# Patient Record
Sex: Male | Born: 1980 | Hispanic: No | Smoking: Former smoker
Health system: Southern US, Community
[De-identification: ages and names within clinical notes are randomized; demographics above are authoritative.]

---

## 2018-10-21 ENCOUNTER — Encounter: Payer: Self-pay | Admitting: Podiatry

## 2018-10-21 ENCOUNTER — Ambulatory Visit: Payer: 59 | Admitting: Podiatry

## 2018-10-21 ENCOUNTER — Other Ambulatory Visit: Payer: Self-pay | Admitting: Podiatry

## 2018-10-21 ENCOUNTER — Ambulatory Visit (INDEPENDENT_AMBULATORY_CARE_PROVIDER_SITE_OTHER): Payer: 59

## 2018-10-21 VITALS — BP 110/52

## 2018-10-21 DIAGNOSIS — M79672 Pain in left foot: Secondary | ICD-10-CM

## 2018-10-21 DIAGNOSIS — M779 Enthesopathy, unspecified: Secondary | ICD-10-CM

## 2018-10-21 NOTE — Progress Notes (Signed)
Subjective:   Patient ID: Patrick Peters, male   DOB: 38 y.o.   MRN: [id number]   HPI Patient presents stating that he is very active and loves to run marathons and he has developed some pain in his left foot and then developed some red spots on his digits which were somewhat irritative.  Patient used to smoke and has stopped it is very active at this time   Review of Systems  All other systems reviewed and are negative.       Objective:  Physical Exam Vitals signs and nursing note reviewed.  Constitutional:      Appearance: He is well-developed.  Pulmonary:     Effort: Pulmonary effort is normal.  Musculoskeletal: Normal range of motion.  Skin:    General: Skin is warm.  Neurological:     Mental Status: He is alert.     Neurovascular status intact muscle strength adequate range of motion within normal limits with patient noted to have mild dorsal tendon irritation left and is noted to have discoloration of the distal digits left over right that are not as bad but for a while he stated were very pinpoint and painful.  Patient does have cool toes and states at times he seems to get symptoms associated with this.  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  We may consider dorsal tendon injection left if symptoms worsen but at this time we will try ice therapy and will be seen back as needed  X-ray indicates no signs of stress fracture arthritis or bone spur formation

## 2018-10-21 NOTE — Patient Instructions (Signed)
Raynaud Phenomenon    Raynaud phenomenon is a condition that affects the blood vessels (arteries) that carry blood to your fingers and toes. The arteries that supply blood to your ears, lips, nipples, or the tip of your nose might also be affected. Raynaud phenomenon causes the arteries to become narrow temporarily (spasm). As a result, the flow of blood to the affected areas is temporarily decreased. This usually occurs in response to cold temperatures or stress. During an attack, the skin in the affected areas turns white, then blue, and finally red. You may also feel tingling or numbness in those areas.  Attacks usually last for only a brief period, and then the blood flow to the area returns to normal. In most cases, Raynaud phenomenon does not cause serious health problems.  What are the causes?  In many cases, the cause of this condition is not known. The condition may occur on its own (primary Raynaud phenomenon) or may be associated with other diseases or factors (secondary Raynaud phenomenon).  Possible causes may include:  · Diseases or medical conditions that damage the arteries.  · Injuries and repetitive actions that hurt the hands or feet.  · Being exposed to certain chemicals.  · Taking medicines that narrow the arteries.  · Other medical conditions, such as lupus, scleroderma, rheumatoid arthritis, thyroid problems, blood disorders, Sjogren syndrome, or atherosclerosis.  What increases the risk?  The following factors may make you more likely to develop this condition:  · Being 20-40 years old.  · Being male.  · Having a family history of Raynaud phenomenon.  · Living in a cold climate.  · Smoking.  What are the signs or symptoms?  Symptoms of this condition usually occur when you are exposed to cold temperatures or when you have emotional stress. The symptoms may last for a few minutes or up to several hours. They usually affect your fingers but may also affect your toes, nipples, lips, ears, or  the tip of your nose. Symptoms may include:  · Changes in skin color. The skin in the affected areas will turn pale or white. The skin may then change from white to bluish to red as normal blood flow returns to the area.  · Numbness, tingling, or pain in the affected areas.  In severe cases, symptoms may include:  · Skin sores.  · Tissues decaying and dying (gangrene).  How is this diagnosed?  This condition may be diagnosed based on:  · Your symptoms and medical history.  · A physical exam. During the exam, you may be asked to put your hands in cold water to check for a reaction to cold temperature.  · Tests, such as:  ? Blood tests to check for other diseases or conditions.  ? A test to check the movement of blood through your arteries and veins (vascular ultrasound).  ? A test in which the skin at the base of your fingernail is examined under a microscope (nailfold capillaroscopy).  How is this treated?  Treatment for this condition often involves making lifestyle changes and taking steps to control your exposure to cold temperatures. For more severe cases, medicine (calcium channel blockers) may be used to improve blood flow. Surgery is sometimes done to block the nerves that control the affected arteries, but this is rare.  Follow these instructions at home:  Avoiding cold temperatures  Take these steps to avoid exposure to cold:  · If possible, stay indoors during cold weather.  · When you   go outside during cold weather, dress in layers and wear mittens, a hat, a scarf, and warm footwear.  · Wear mittens or gloves when handling ice or frozen food.  · Use holders for glasses or cans containing cold drinks.  · Let warm water run for a while before taking a shower or bath.  · Warm up the car before driving in cold weather.  Lifestyle    · If possible, avoid stressful and emotional situations. Try to find ways to manage your stress, such as:  ? Exercise.  ? Yoga.  ? Meditation.  ? Biofeedback.  · Do not use any  products that contain nicotine or tobacco, such as cigarettes and e-cigarettes. If you need help quitting, ask your health care provider.  · Avoid secondhand smoke.  · Limit your use of caffeine.  ? Switch to decaffeinated coffee, tea, and soda.  ? Avoid chocolate.  · Avoid vibrating tools and machinery.  General instructions  · Protect your hands and feet from injuries, cuts, or bruises.  · Avoid wearing tight rings or wristbands.  · Wear loose fitting socks and comfortable, roomy shoes.  · Take over-the-counter and prescription medicines only as told by your health care provider.  Contact a health care provider if:  · Your discomfort becomes worse despite lifestyle changes.  · You develop sores on your fingers or toes that do not heal.  · Your fingers or toes turn black.  · You have breaks in the skin on your fingers or toes.  · You have a fever.  · You have pain or swelling in your joints.  · You have a rash.  · Your symptoms occur on only one side of your body.  Summary  · Raynaud phenomenon is a condition that affects the arteries that carry blood to your fingers, toes, ears, lips, nipples, or the tip of your nose.  · In many cases, the cause of this condition is not known.  · Symptoms of this condition include changes in skin color, and numbness and tingling of the affected area.  · Treatment for this condition includes lifestyle changes, reducing exposure to cold temperatures, and using medicines for severe cases of the condition.  · Contact your health care provider if your condition worsens despite treatment.  This information is not intended to replace advice given to you by your health care provider. Make sure you discuss any questions you have with your health care provider.  Document Released: 08/22/2000 Document Revised: 03/10/2017 Document Reviewed: 10/06/2016  Elsevier Interactive Patient Education © 2019 [employer]

## 2018-10-26 ENCOUNTER — Encounter: Payer: Self-pay | Admitting: Plastic Surgery

## 2018-10-26 ENCOUNTER — Ambulatory Visit (INDEPENDENT_AMBULATORY_CARE_PROVIDER_SITE_OTHER): Payer: 59 | Admitting: Plastic Surgery

## 2018-10-26 VITALS — BP 120/80 | HR 60 | Temp 97.8°F | Ht 74.0 in | Wt 205.2 lb

## 2018-10-26 DIAGNOSIS — L723 Sebaceous cyst: Secondary | ICD-10-CM

## 2018-10-26 NOTE — Progress Notes (Signed)
     Patient ID: Patrick Peters, male    DOB: 1980-12-25, 39 y.o.   MRN: [id number]   Chief Complaint  Patient presents with  . Advice Only    for cyst beside the (L) eye    The patient is a 38 year old male here for evaluation of the cyst on the left periorbital lateral area.  He states it is been there for couple of years but seems to be getting larger.  He has a history of multiple cysts throughout his body particularly cystic acne on his face.  Things are calm right now and he seems to be doing well.  There does not appear to be any infection in the area and no redness.  He is otherwise healthy.   Review of Systems  Constitutional: Negative.  Negative for activity change.  HENT: Negative.   Eyes: Negative.   Respiratory: Negative.   Gastrointestinal: Negative.   Genitourinary: Negative.   Skin: Negative for wound.    History reviewed. No pertinent past medical history.  History reviewed. No pertinent surgical history.    Current Outpatient Medications:  .  Adapalene-Benzoyl Peroxide 0.1-2.5 % gel, , Disp: , Rfl:  .  clindamycin (CLINDAGEL) 1 % gel, Apply topically., Disp: , Rfl:  .  famotidine (PEPCID) 20 MG tablet, , Disp: , Rfl:    Objective:   Vitals:   10/26/18 1329  BP: 120/80  Pulse: 60  Temp: 97.8 F (36.6 C)  SpO2: 96%    Physical Exam Vitals signs and nursing note reviewed.  Constitutional:      Appearance: Normal appearance.  HENT:     Head: Normocephalic and atraumatic.  Eyes:   Cardiovascular:     Rate and Rhythm: Normal rate.  Abdominal:     General: Abdomen is flat. There is no distension.  Neurological:     Mental Status: He is alert.  Psychiatric:        Mood and Affect: Mood normal.        Thought Content: Thought content normal.        Judgment: Judgment normal.     Assessment & Plan:  Sebaceous cyst Plan for excision of left lateral cyst excision.  Alena Bills Cassidy Tashiro, DO

## 2018-11-23 ENCOUNTER — Ambulatory Visit: Payer: 59 | Admitting: Plastic Surgery

## 2018-11-23 ENCOUNTER — Encounter: Payer: Self-pay | Admitting: Plastic Surgery

## 2018-11-23 ENCOUNTER — Other Ambulatory Visit: Payer: Self-pay

## 2018-11-23 ENCOUNTER — Ambulatory Visit (INDEPENDENT_AMBULATORY_CARE_PROVIDER_SITE_OTHER): Payer: 59 | Admitting: Plastic Surgery

## 2018-11-23 VITALS — BP 125/77 | HR 55 | Temp 98.0°F | Ht 74.0 in | Wt 212.8 lb

## 2018-11-23 DIAGNOSIS — L723 Sebaceous cyst: Secondary | ICD-10-CM | POA: Diagnosis not present

## 2018-11-24 NOTE — Progress Notes (Signed)
Preoperative Dx: sebaceous cyst of left periorbital area  Postoperative Dx: Same  Procedure: excision of 1 cm left periorbital sebaceous cyst  Surgeon: Dr. Alan Ripper Dillingham  Anesthesia: Lidocaine 1% with 1:100,000 epinepherine  Indication for Procedure: cyst  Description of Procedure: Risks and complications were explained to the patient.  Consent was confirmed.  Time out was called and all information was confirmed to be correct.  The area was prepped with chlorhexidine and drapped.  Lidocaine 1% with epinepherine was injected in the subcutaneous area.  After waiting several minutes for the lidocaine to take affect a #15 blade was used to incise the skin over the 1 cm cyst.  The scissors were used to dissect around the capsule and remove it completely.  A 5-0 Monocryl was used to close the skin edges with simple interrupted sutures.  Steri strips were applied.  The patient is to follow up in one week.  He tolerated the procedure well and there were no complications.

## 2018-11-30 ENCOUNTER — Encounter: Payer: Self-pay | Admitting: Plastic Surgery

## 2018-11-30 ENCOUNTER — Other Ambulatory Visit: Payer: Self-pay

## 2018-11-30 ENCOUNTER — Ambulatory Visit (INDEPENDENT_AMBULATORY_CARE_PROVIDER_SITE_OTHER): Payer: 59 | Admitting: Plastic Surgery

## 2018-11-30 VITALS — BP 115/76 | HR 64 | Temp 98.0°F | Ht 74.0 in | Wt 213.0 lb

## 2018-11-30 DIAGNOSIS — L723 Sebaceous cyst: Secondary | ICD-10-CM

## 2018-11-30 NOTE — Progress Notes (Signed)
The patient is a 38 yrs old here for follow up after excision of a cyst on the left lateral periorbital area.  Area healing well.  No sign of infection.  Sutures removed.  Steri strip applied  Can be removed in a week.

## 2020-10-01 ENCOUNTER — Ambulatory Visit (HOSPITAL_COMMUNITY)
Admission: EM | Admit: 2020-10-01 | Discharge: 2020-10-01 | Disposition: A | Discharge status: Home / Self Care | Payer: Managed Care, Other (non HMO) | Attending: Internal Medicine | Admitting: Internal Medicine

## 2020-10-01 ENCOUNTER — Other Ambulatory Visit: Payer: Self-pay

## 2020-10-01 ENCOUNTER — Encounter (HOSPITAL_COMMUNITY): Payer: Self-pay

## 2020-10-01 ENCOUNTER — Ambulatory Visit (INDEPENDENT_AMBULATORY_CARE_PROVIDER_SITE_OTHER): Payer: Managed Care, Other (non HMO)

## 2020-10-01 DIAGNOSIS — S62355A Nondisplaced fracture of shaft of fourth metacarpal bone, left hand, initial encounter for closed fracture: Secondary | ICD-10-CM | POA: Diagnosis not present

## 2020-10-01 DIAGNOSIS — R2232 Localized swelling, mass and lump, left upper limb: Secondary | ICD-10-CM | POA: Diagnosis not present

## 2020-10-01 MED ORDER — HYDROCODONE-ACETAMINOPHEN 5-325 MG PO TABS: 1.0000 | ORAL_TABLET | Freq: Four times a day (QID) | ORAL | 0 refills | Status: AC | PRN

## 2020-10-01 MED ORDER — IBUPROFEN 800 MG PO TABS: 800.0000 mg | ORAL_TABLET | Freq: Three times a day (TID) | ORAL | 0 refills | Status: AC | PRN

## 2020-10-01 NOTE — Progress Notes (Signed)
Orthopedic Tech Progress Note Patient Details:  Patrick Peters [birth year] [id number]  Ortho Devices Type of Ortho Device: Ulna gutter splint Ortho Device/Splint Location: Left Upper Extremity Ortho Device/Splint Interventions: Ordered,Application   Post Interventions Patient Tolerated: Well Instructions Provided: Care of device,Poper ambulation with device   Gerald Stabs 10/01/2020, 6:31 PM

## 2020-10-01 NOTE — Discharge Instructions (Signed)
Your have a fracture of the 4th metacarpal.    Take the ibuprofen as directed for mild-moderate pain.  Take the hydrocodone-acetaminophen as directed for moderate-severe pain; do not drive, operate machinery, or drink alcohol with this medication as it causes drowsiness.    Rest and elevate your hand.  Apply ice packs 2-3 times a day for up to 20 minutes each.  Wear the splint.     Call Dr. Debby Bud office tomorrow to schedule an appointment.

## 2020-10-01 NOTE — ED Provider Notes (Signed)
[facility]    CSN: [id number] Arrival date & time: 10/01/20  1654      History   Chief Complaint Chief Complaint  Patient presents with  . Hand Injury    Occurred saturday    HPI Patrick Peters is a 40 y.o. male.   Presents with pain and swelling in his left hand after falling while snowboarding on 09/29/2020.  He states he landed on his left hand and bent it backwards.  OTC treatment attempted at home.  He denies numbness, weakness, paresthesias, open wounds, or other symptoms.  Denies pertinent medical history.  The history is provided by the patient.    History reviewed. No pertinent past medical history.  Patient Active Problem List   Diagnosis Date Noted  . Sebaceous cyst 10/26/2018    History reviewed. No pertinent surgical history.     Home Medications    Prior to Admission medications   Medication Sig Start Date End Date Taking? Authorizing Provider  famotidine (PEPCID) 20 MG tablet  09/23/18  Yes [provider]  HYDROcodone-acetaminophen (NORCO/VICODIN) 5-325 MG tablet Take 1-2 tablets by mouth every 6 (six) hours as needed. 10/01/20  Yes Mickie Bail, NP  ibuprofen (ADVIL) 800 MG tablet Take 1 tablet (800 mg total) by mouth every 8 (eight) hours as needed. 10/01/20  Yes Mickie Bail, NP  Adapalene-Benzoyl Peroxide 0.1-2.5 % gel  08/14/15   [provider]  albuterol (PROVENTIL HFA;VENTOLIN HFA) 108 (90 Base) MCG/ACT inhaler Inhale into the lungs. 11/15/18   [provider]  albuterol (PROVENTIL HFA;VENTOLIN HFA) 108 (90 Base) MCG/ACT inhaler INHALE 2 PUFFS INTO THE LUNGS Q 4-6 HOURS PRN FOR WHEEZING 11/15/18   [provider]  clindamycin (CLINDAGEL) 1 % gel Apply topically. 07/07/18   [provider]    Family History History reviewed. No pertinent family history.  Social History Social History   Tobacco Use  . Smoking status: Former Games developer  . Smokeless tobacco: Former Clinical biochemist  . Vaping Use:  Never used  Substance Use Topics  . Alcohol use: Yes  . Drug use: Never     Allergies   Patient has no known allergies.   Review of Systems Review of Systems  Constitutional: Negative for chills and fever.  HENT: Negative for ear pain and sore throat.   Eyes: Negative for pain and visual disturbance.  Respiratory: Negative for cough and shortness of breath.   Cardiovascular: Negative for chest pain and palpitations.  Gastrointestinal: Negative for abdominal pain and vomiting.  Genitourinary: Negative for dysuria and hematuria.  Musculoskeletal: Positive for arthralgias. Negative for back pain.  Skin: Negative for color change and wound.  Neurological: Negative for syncope, weakness and numbness.  All other systems reviewed and are negative.    Physical Exam Triage Vital Signs ED Triage Vitals  Enc Vitals Group     BP      Pulse      Resp      Temp      Temp src      SpO2      Weight      Height      Head Circumference      Peak Flow      Pain Score      Pain Loc      Pain Edu?      Excl. in GC?    No data found.  Updated Vital Signs BP (!) 121/95 (BP Location: Right Arm)  Pulse 89   Temp 98 F (36.7 C) (Oral)   Resp 17   SpO2 99%   Visual Acuity Right Eye Distance:   Left Eye Distance:   Bilateral Distance:    Right Eye Near:   Left Eye Near:    Bilateral Near:     Physical Exam Vitals and nursing note reviewed.  Constitutional:      General: He is not in acute distress.    Appearance: He is well-developed and well-nourished.  HENT:     Head: Normocephalic and atraumatic.     Mouth/Throat:     Mouth: Mucous membranes are moist.  Eyes:     Conjunctiva/sclera: Conjunctivae normal.  Cardiovascular:     Rate and Rhythm: Normal rate and regular rhythm.     Heart sounds: Normal heart sounds.  Pulmonary:     Effort: Pulmonary effort is normal. No respiratory distress.     Breath sounds: Normal breath sounds.  Abdominal:     Palpations:  Abdomen is soft.     Tenderness: There is no abdominal tenderness.  Musculoskeletal:        General: Swelling and tenderness present. No edema. Normal range of motion.       Hands:     Cervical back: Neck supple.  Skin:    General: Skin is warm and dry.     Capillary Refill: Capillary refill takes less than 2 seconds.     Findings: Bruising present. No lesion.  Neurological:     General: No focal deficit present.     Mental Status: He is alert and oriented to person, place, and time.     Sensory: No sensory deficit.     Motor: No weakness.     Gait: Gait normal.  Psychiatric:        Mood and Affect: Mood and affect and mood normal.        Behavior: Behavior normal.      UC Treatments / Results  Labs (all labs ordered are listed, but only abnormal results are displayed) Labs Reviewed - No data to display  EKG   Radiology DG Hand Complete Left  Result Date: 10/01/2020 CLINICAL DATA:  Swelling and loss of range of motion EXAM: LEFT HAND - COMPLETE 3+ VIEW COMPARISON:  None. FINDINGS: Acute nondisplaced fracture involving the mid to proximal shaft of the fourth metacarpal. No significant angulation. No subluxation IMPRESSION: Acute nondisplaced fracture involving the fourth metacarpal. Electronically Signed   By: Jasmine Pang M.D.   On: 10/01/2020 17:47    Procedures Procedures (including critical care time)  Medications Ordered in UC Medications - No data to display  Initial Impression / Assessment and Plan / UC Course  I have reviewed the triage vital signs and the nursing notes.  Pertinent labs & imaging results that were available during my care of the patient were reviewed by me and considered in my medical decision making (see chart for details).   Closed nondisplaced fracture of left fourth metacarpal.  Treating mild to moderate pain with ibuprofen; moderate to severe pain with Norco.  Precautions for drowsiness with Norco discussed.  Also treating with rest,  elevation, ice packs.  Ulnar gutter splint applied by Ortho tech.  Neurovascular status intact before and after.  Instructed patient to call Dr. Debby Bud office tomorrow morning to schedule an appointment.  He agrees to plan of care.   Final Clinical Impressions(s) / UC Diagnoses   Final diagnoses:  Closed nondisplaced fracture of shaft of fourth metacarpal  bone of left hand, initial encounter     Discharge Instructions     Your have a fracture of the 4th metacarpal.    Take the ibuprofen as directed for mild-moderate pain.  Take the hydrocodone-acetaminophen as directed for moderate-severe pain; do not drive, operate machinery, or drink alcohol with this medication as it causes drowsiness.    Rest and elevate your hand.  Apply ice packs 2-3 times a day for up to 20 minutes each.  Wear the splint.     Call Dr. Debby Bud office tomorrow to schedule an appointment.            ED Prescriptions    Medication Sig Dispense Auth. Provider   ibuprofen (ADVIL) 800 MG tablet Take 1 tablet (800 mg total) by mouth every 8 (eight) hours as needed. 21 tablet Wendee Beavers H, NP   HYDROcodone-acetaminophen (NORCO/VICODIN) 5-325 MG tablet Take 1-2 tablets by mouth every 6 (six) hours as needed. 6 tablet Mickie Bail, NP     I have reviewed the PDMP during this encounter.   Mickie Bail, NP 10/01/20 1815

## 2020-10-01 NOTE — ED Triage Notes (Signed)
Patient states he was snowboarding on Saturday and fell trying to catch himself with his left hand. Pt states he thinks it bent back. Pt now has some swelling and bruising as well as significant pain. Pt is aox4 and ambulatory.

## 2021-10-19 IMAGING — DX DG HAND COMPLETE 3+V*L*
3 series · 3 of 3 positions shown · non-contrast
Comparison: None.

CLINICAL DATA: Swelling and loss of range of motion

EXAM:
LEFT HAND - COMPLETE 3+ VIEW

[hand pa]
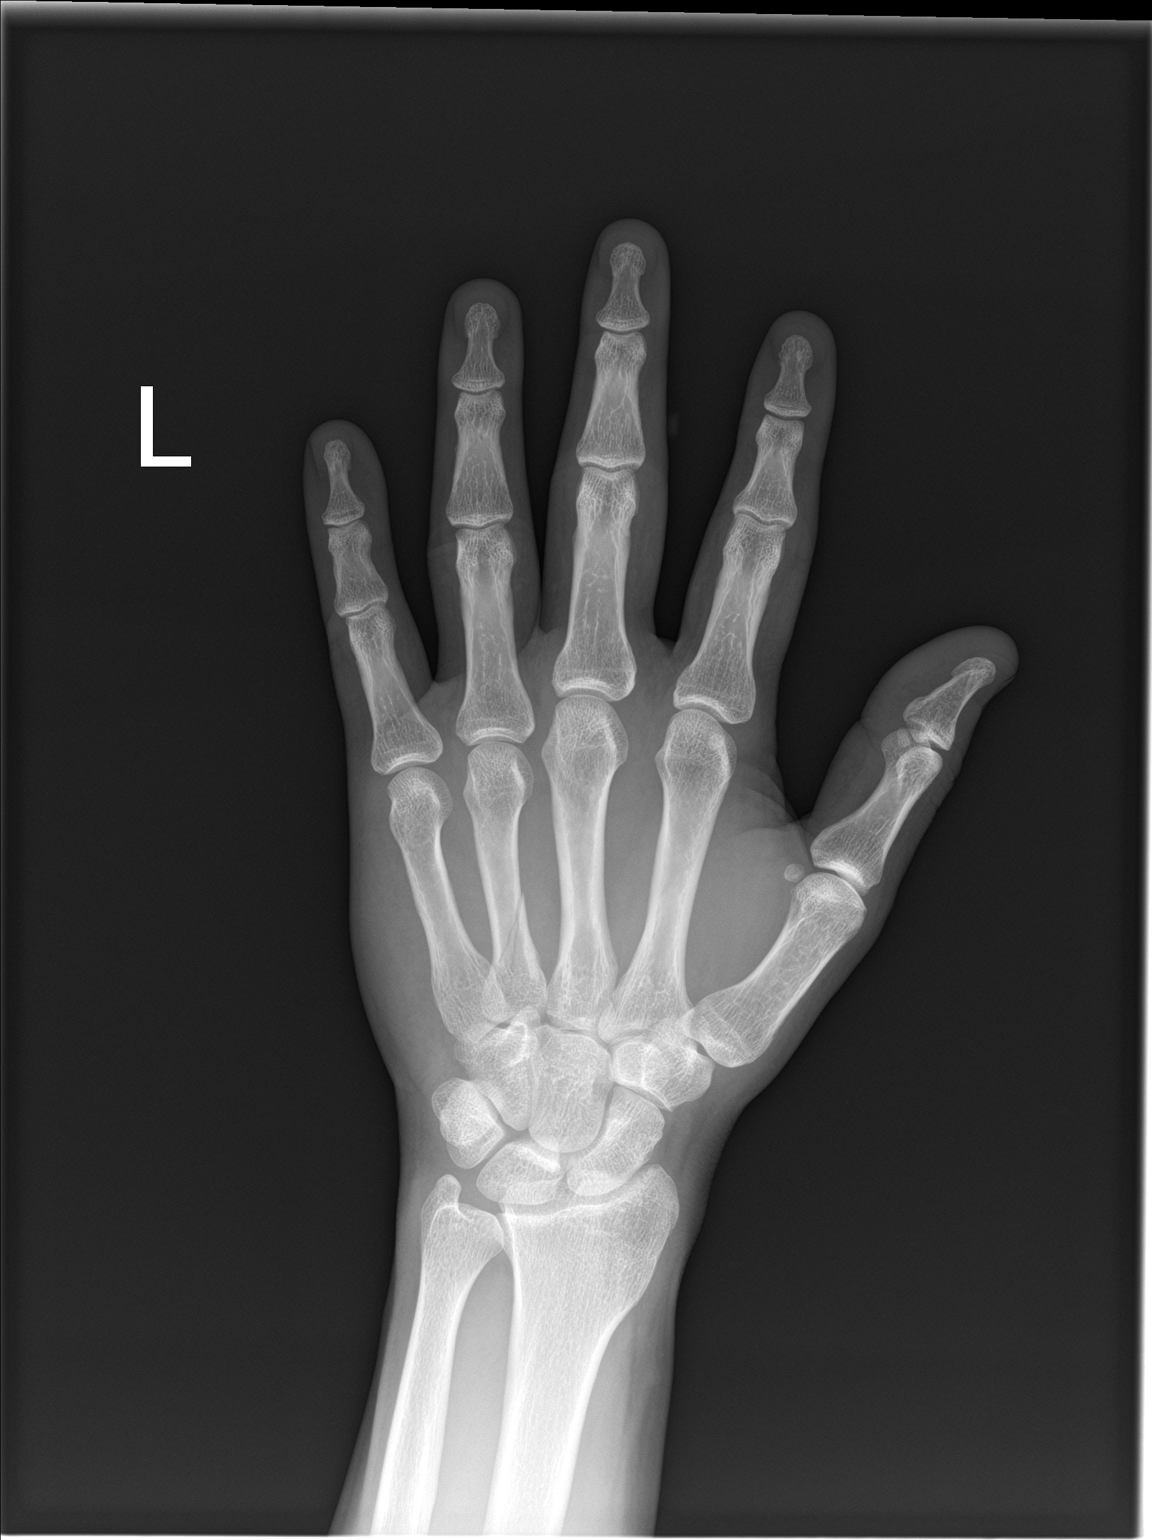

[hand obl]
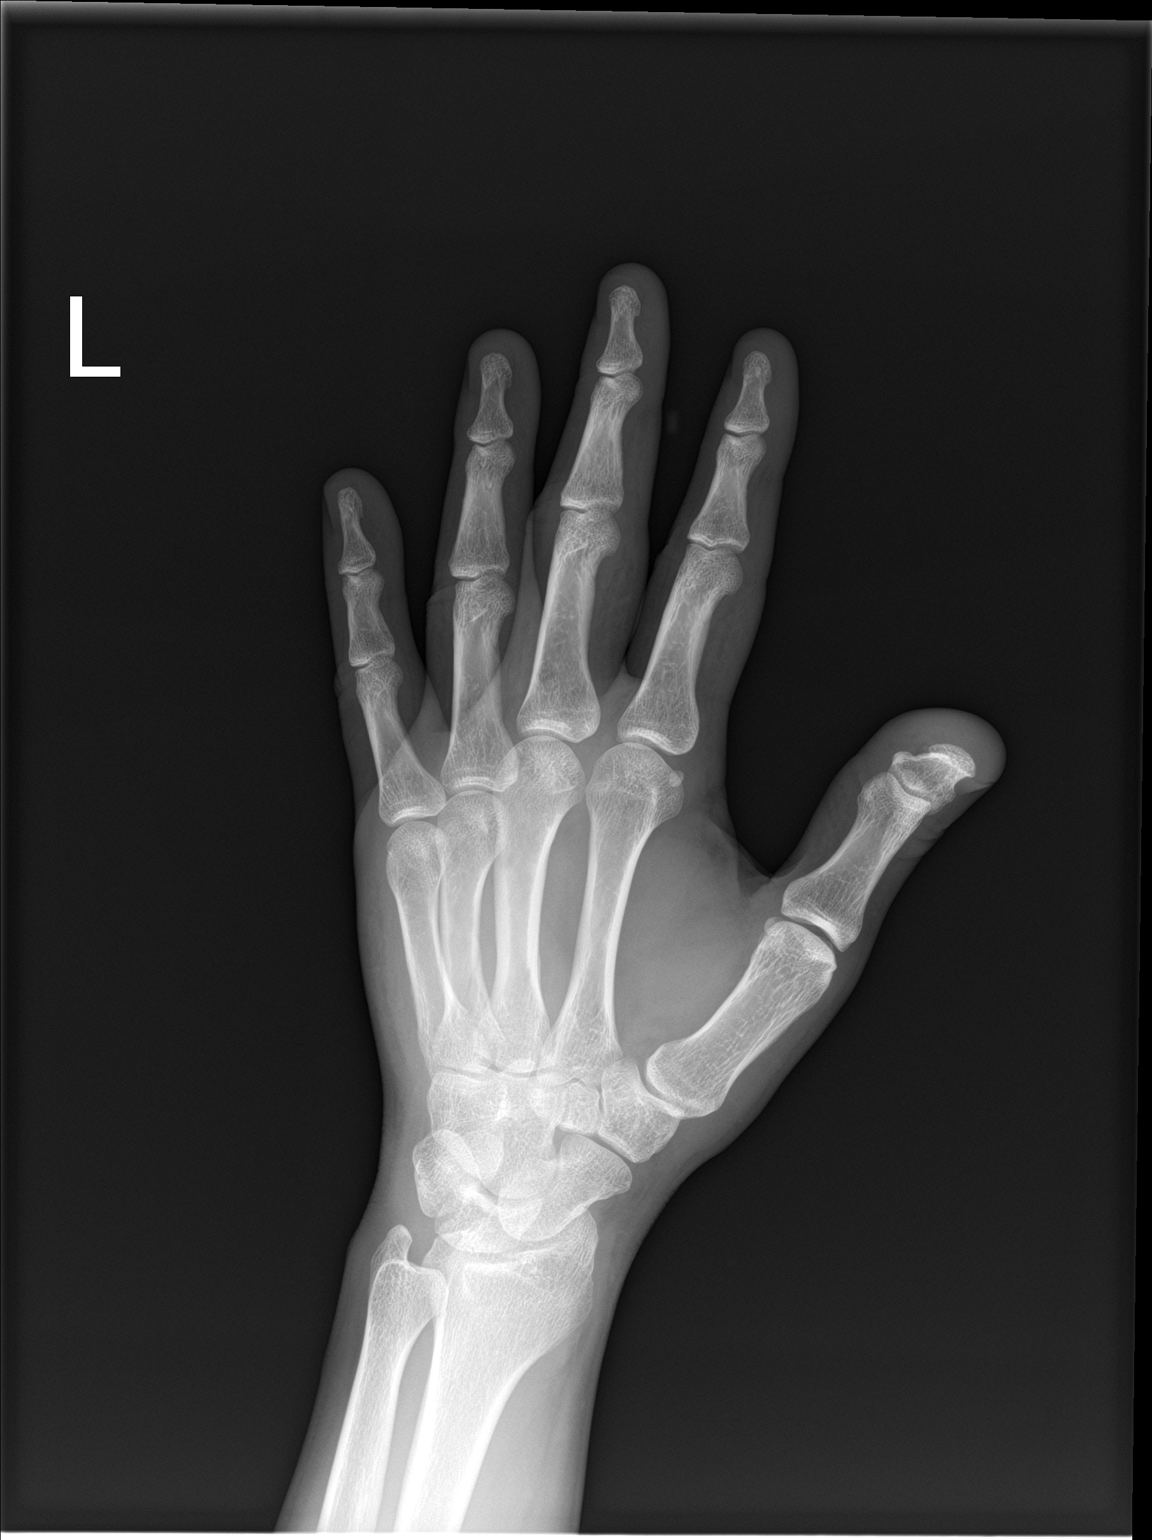

[hand lat]
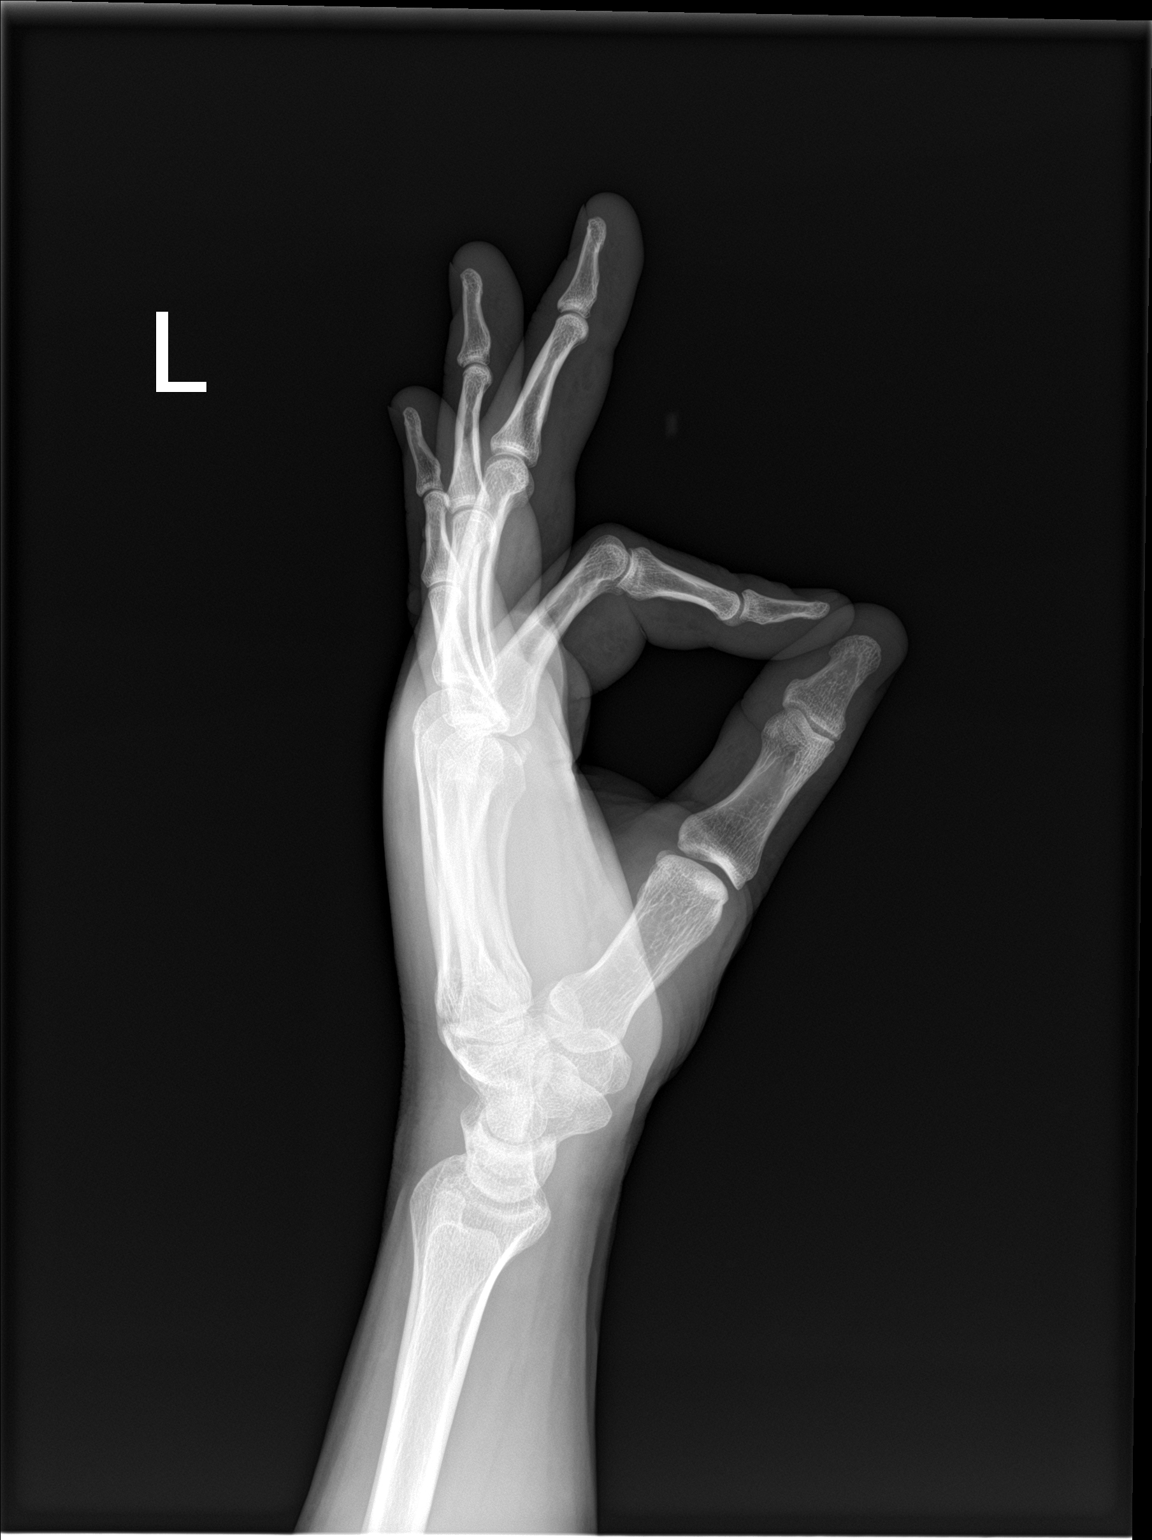

[3 of 3 positions shown; findings below may reference images not displayed]

FINDINGS: Acute nondisplaced fracture involving the mid to proximal shaft of
the fourth metacarpal. No significant angulation. No subluxation
IMPRESSION: Acute nondisplaced fracture involving the fourth metacarpal.
